# Patient Record
Sex: Male | Born: 1980 | Race: White | Hispanic: No | Marital: Single | Smoking: Former smoker
Health system: Southern US, Community
[De-identification: ages and names within clinical notes are randomized; demographics above are authoritative.]

## PROBLEM LIST (undated history)

## (undated) DIAGNOSIS — F1010 Alcohol abuse, uncomplicated: Secondary | ICD-10-CM

## (undated) DIAGNOSIS — K859 Acute pancreatitis without necrosis or infection, unspecified: Secondary | ICD-10-CM

---

## 2019-10-03 ENCOUNTER — Emergency Department (HOSPITAL_COMMUNITY): Payer: No Typology Code available for payment source

## 2019-10-03 ENCOUNTER — Other Ambulatory Visit: Payer: Self-pay

## 2019-10-03 ENCOUNTER — Emergency Department (HOSPITAL_COMMUNITY)
Admission: EM | Admit: 2019-10-03 | Discharge: 2019-10-03 | Disposition: A | Discharge status: Home / Self Care | Payer: No Typology Code available for payment source | Attending: Emergency Medicine | Admitting: Emergency Medicine

## 2019-10-03 DIAGNOSIS — K852 Alcohol induced acute pancreatitis without necrosis or infection: Secondary | ICD-10-CM

## 2019-10-03 DIAGNOSIS — R1013 Epigastric pain: Secondary | ICD-10-CM | POA: Diagnosis present

## 2019-10-03 HISTORY — DX: Acute pancreatitis without necrosis or infection, unspecified: K85.90

## 2019-10-03 HISTORY — DX: Alcohol abuse, uncomplicated: F10.10

## 2019-10-03 LAB — CBC WITH DIFFERENTIAL/PLATELET
Abs Immature Granulocytes: 0.06 10*3/uL (ref 0.00–0.07)
Basophils Absolute: 0.1 10*3/uL (ref 0.0–0.1)
Basophils Relative: 1 %
Eosinophils Absolute: 0.1 10*3/uL (ref 0.0–0.5)
Eosinophils Relative: 2 %
HCT: 40.2 % (ref 39.0–52.0)
Hemoglobin: 14.1 g/dL (ref 13.0–17.0)
Immature Granulocytes: 1 %
Lymphocytes Relative: 8 %
Lymphs Abs: 0.5 10*3/uL — ABNORMAL LOW (ref 0.7–4.0)
MCH: 35.8 pg — ABNORMAL HIGH (ref 26.0–34.0)
MCHC: 35.1 g/dL (ref 30.0–36.0)
MCV: 102 fL — ABNORMAL HIGH (ref 80.0–100.0)
Monocytes Absolute: 0.6 10*3/uL (ref 0.1–1.0)
Monocytes Relative: 9 %
Neutro Abs: 5.1 10*3/uL (ref 1.7–7.7)
Neutrophils Relative %: 79 %
Platelets: UNDETERMINED 10*3/uL (ref 150–400)
RBC: 3.94 MIL/uL — ABNORMAL LOW (ref 4.22–5.81)
RDW: 11.4 % — ABNORMAL LOW (ref 11.5–15.5)
WBC: 6.4 10*3/uL (ref 4.0–10.5)
nRBC: 0 % (ref 0.0–0.2)

## 2019-10-03 LAB — COMPREHENSIVE METABOLIC PANEL
ALT: 21 U/L (ref 0–44)
AST: 37 U/L (ref 15–41)
Albumin: 3.4 g/dL — ABNORMAL LOW (ref 3.5–5.0)
Alkaline Phosphatase: 68 U/L (ref 38–126)
Anion gap: 11 (ref 5–15)
BUN: <5 mg/dL — ABNORMAL LOW (ref 6–20)
CO2: 21 mmol/L — ABNORMAL LOW (ref 22–32)
Calcium: 8.9 mg/dL (ref 8.9–10.3)
Chloride: 105 mmol/L (ref 98–111)
Creatinine, Ser: 0.6 mg/dL — ABNORMAL LOW (ref 0.61–1.24)
GFR calc Af Amer: >60 mL/min (ref >60)
GFR calc non Af Amer: >60 mL/min (ref >60)
Glucose, Bld: 109 mg/dL — ABNORMAL HIGH (ref 70–99)
Potassium: 4.8 mmol/L (ref 3.5–5.1)
Sodium: 137 mmol/L (ref 135–145)
Total Bilirubin: 1.1 mg/dL (ref 0.3–1.2)
Total Protein: 6.1 g/dL — ABNORMAL LOW (ref 6.5–8.1)

## 2019-10-03 LAB — URINALYSIS, ROUTINE W REFLEX MICROSCOPIC
Bilirubin Urine: NEGATIVE
Glucose, UA: NEGATIVE mg/dL
Hgb urine dipstick: NEGATIVE
Ketones, ur: 5 mg/dL — AB
Leukocytes,Ua: NEGATIVE
Nitrite: NEGATIVE
Protein, ur: NEGATIVE mg/dL
Specific Gravity, Urine: 1.003 — ABNORMAL LOW (ref 1.005–1.030)
pH: 6 (ref 5.0–8.0)

## 2019-10-03 LAB — LIPASE, BLOOD: Lipase: 2743 U/L — ABNORMAL HIGH (ref 11–51)

## 2019-10-03 MED ORDER — OXYCODONE-ACETAMINOPHEN 5-325 MG PO TABS: 1.0000 | ORAL_TABLET | Freq: Four times a day (QID) | ORAL | 0 refills | Status: DC | PRN

## 2019-10-03 MED ORDER — IOHEXOL 300 MG/ML  SOLN
100.0000 mL | Freq: Once | INTRAMUSCULAR | Status: AC | PRN
  Administered 2019-10-03: 20:00:00 100 mL via INTRAVENOUS

## 2019-10-03 MED ORDER — SODIUM CHLORIDE 0.9 % IV BOLUS
1000.0000 mL | Freq: Once | INTRAVENOUS | Status: AC
  Administered 2019-10-03: 1000 mL via INTRAVENOUS

## 2019-10-03 MED ORDER — ONDANSETRON 4 MG PO TBDP: 4.0000 mg | ORAL_TABLET | Freq: Three times a day (TID) | ORAL | 0 refills | Status: DC | PRN

## 2019-10-03 NOTE — ED Provider Notes (Signed)
[facility] EMERGENCY DEPARTMENT Provider Note   CSN: [id number] Arrival date & time: 10/03/19  1712     History   Chief Complaint Chief Complaint  Patient presents with  . Abdominal Pain    HPI Eddie Campbell is a 37 y.o. male.     Patient with history of alcohol abuse presents to the emergency department with complaint of intermittent abdominal pain.  Patient states that he was a heavy drinker up until about 1 month ago.  He states that he has been sober since that time.  Patient states that starting about a week ago he had intermittent episodes of sudden abdominal pain lasting for several hours at a time.  Patient then had several days where he did not have any symptoms at all but then had an episode today prompting emergency department visit.  Pain was very intense when it occurred.  Symptoms are currently resolved.  No associated nausea, vomiting, constipation.  No fevers, chest pain or shortness of breath.  No urinary symptoms.  Several days ago patient did have a few episodes of nonbloody diarrhea.  No history of heavy NSAID use.  Patient does not have a history of gallstones or gallbladder disease.  He is not diabetic.      Past Medical History:  Diagnosis Date  . Acute pancreatitis   . Alcohol abuse   . Pancreatic pseudocyst     There are no active problems to display for this patient.   The histories are not reviewed yet. Please review them in the "History" navigator section and refresh this SmartLink.      Home Medications    Prior to Admission medications   Medication Sig Start Date End Date Taking? Authorizing Provider  ondansetron (ZOFRAN ODT) 4 MG disintegrating tablet Take 1 tablet (4 mg total) by mouth every 8 (eight) hours as needed for nausea or vomiting. 10/03/19   Renne Crigler, PA-C  oxyCODONE-acetaminophen (PERCOCET/ROXICET) 5-325 MG tablet Take 1 tablet by mouth every 6 (six) hours as needed for severe pain. 10/03/19   Renne Crigler, PA-C    Family History No family history on file.  Social History Social History   Tobacco Use  . Smoking status: Not on file  Substance Use Topics  . Alcohol use: Yes  . Drug use: Not on file     Allergies   Patient has no known allergies.   Review of Systems Review of Systems  Constitutional: Negative for fever.  HENT: Negative for rhinorrhea and sore throat.   Eyes: Negative for redness.  Respiratory: Negative for cough.   Cardiovascular: Negative for chest pain.  Gastrointestinal: Positive for abdominal pain (Resolved) and diarrhea (Resolved). Negative for nausea and vomiting.  Genitourinary: Negative for dysuria.  Musculoskeletal: Negative for myalgias.  Skin: Negative for rash.  Neurological: Negative for headaches.     Physical Exam Updated Vital Signs BP (!) 145/109 (BP Location: Right Arm)   Pulse 85   Temp 98.3 F (36.8 C) (Oral)   Resp 16   Ht  (1.88 m)   Wt 81.6 kg   SpO2 99%   BMI 23.11 kg/m   Physical Exam Vitals signs and nursing note reviewed.  Constitutional:      Appearance: He is well-developed.  HENT:     Head: Normocephalic and atraumatic.  Eyes:     General:        Right eye: No discharge.        Left eye: No discharge.  Conjunctiva/sclera: Conjunctivae normal.  Neck:     Musculoskeletal: Normal range of motion and neck supple.  Cardiovascular:     Rate and Rhythm: Normal rate and regular rhythm.     Heart sounds: Normal heart sounds.  Pulmonary:     Effort: Pulmonary effort is normal.     Breath sounds: Normal breath sounds.  Abdominal:     Palpations: Abdomen is soft.     Tenderness: There is no abdominal tenderness. There is no guarding or rebound.     Hernia: No hernia is present.  Skin:    General: Skin is warm and dry.  Neurological:     Mental Status: He is alert.      ED Treatments / Results  Labs (all labs ordered are listed, but only abnormal results are displayed) Labs Reviewed  CBC  WITH DIFFERENTIAL/PLATELET - Abnormal; Notable for the following components:      Result Value   RBC 3.94 (*)    MCV 102.0 (*)    MCH 35.8 (*)    RDW 11.4 (*)    Lymphs Abs 0.5 (*)    All other components within normal limits  COMPREHENSIVE METABOLIC PANEL - Abnormal; Notable for the following components:   CO2 21 (*)    Glucose, Bld 109 (*)    BUN <5 (*)    Creatinine, Ser 0.60 (*)    Total Protein 6.1 (*)    Albumin 3.4 (*)    All other components within normal limits  LIPASE, BLOOD - Abnormal; Notable for the following components:   Lipase 2,743 (*)    All other components within normal limits  URINALYSIS, ROUTINE W REFLEX MICROSCOPIC - Abnormal; Notable for the following components:   Color, Urine STRAW (*)    Specific Gravity, Urine 1.003 (*)    Ketones, ur 5 (*)    All other components within normal limits  CBC WITH DIFFERENTIAL/PLATELET    EKG None  Radiology Ct Abdomen Pelvis W Contrast  Result Date: 10/03/2019 CLINICAL DATA:  Intense abdominal pain epigastric pain EXAM: CT ABDOMEN AND PELVIS WITH CONTRAST TECHNIQUE: Multidetector CT imaging of the abdomen and pelvis was performed using the standard protocol following bolus administration of intravenous contrast. CONTRAST:  128mL OMNIPAQUE IOHEXOL 300 MG/ML  SOLN COMPARISON:  Radiograph 10/03/2019 FINDINGS: Lower chest: Lung bases demonstrate no acute consolidation or pleural effusion. The heart size is normal. Hepatobiliary: Contracted gallbladder. No calcified stones. No biliary dilatation. No focal hepatic abnormality. Pancreas: Enlarged. Moderate surrounding inflammatory changes consistent with acute pancreatitis. Small rounded fluid collections at the pancreatic head and uncinate process, the largest measures 15 mm in size. Scattered punctate calcifications at the pancreatic head. Spleen: Normal in size without focal abnormality. Adrenals/Urinary Tract: Adrenal glands are unremarkable. Kidneys are normal, without renal  calculi, focal lesion, or hydronephrosis. Bladder is unremarkable. Stomach/Bowel: Moderate enlargement of the stomach. Thickened appearance of the duodenal bulb and second and proximal third portion of the duodenum. No dilated small bowel. No colon wall thickening. Appendix not well seen but no right lower quadrant inflammatory process Vascular/Lymphatic: Nonaneurysmal aorta. Patent portal veins. Slight narrowed appearance of the distal splenic vein but without occlusion. Reproductive: Prostate is unremarkable. Other: No free air. Small fluid within the anterior pararenal spaces. Musculoskeletal: Mild superior endplate deformity at L1. IMPRESSION: 1. Enlarged pancreas with moderate edema and inflammatory changes consistent with acute pancreatitis. Several small focal fluid collections are present at the pancreatic head and uncinate process measuring up to 15 mm, possible small pseudocysts  versus cystic pancreatic masses. 2. Thickening of the duodenal bulb, second and proximal third portion of the duodenum consistent with duodenitis and reactive inflammation from pancreatitis 3. Narrowed appearance of the distal splenic vein likely extrinsic compression from inflammation. Portal vessels are patent. Electronically Signed   By: Jasmine Pang M.D.   On: 10/03/2019 21:07   Dg Abd 2 Views  Result Date: 10/03/2019 CLINICAL DATA:  Intermittent abdominal pain EXAM: ABDOMEN - 2 VIEW COMPARISON:  None. FINDINGS: The bowel gas pattern is normal. Moderate amount of colonic stool. No radio-opaque calculi or other significant radiographic abnormality is seen. IMPRESSION: Nonobstructive bowel gas pattern. Electronically Signed   By: Jonna Clark M.D.   On: 10/03/2019 20:05    Procedures Procedures (including critical care time)  Medications Ordered in ED Medications  sodium chloride 0.9 % bolus 1,000 mL (0 mLs Intravenous Stopped 10/03/19 2113)  iohexol (OMNIPAQUE) 300 MG/ML solution 100 mL (100 mLs Intravenous Contrast  Given 10/03/19 2026)     Initial Impression / Assessment and Plan / ED Course  I have reviewed the triage vital signs and the nursing notes.  Pertinent labs & imaging results that were available during my care of the patient were reviewed by me and considered in my medical decision making (see chart for details).        Patient seen and examined. Work-up initiated. Patient asymptomatic.   Vital signs reviewed and are as follows: BP (!) 145/109 (BP Location: Right Arm)   Pulse 85   Temp 98.3 F (36.8 C) (Oral)   Resp 16   Ht 6\' 2"  (1.88 m)   Wt 81.6 kg   SpO2 99%   BMI 23.11 kg/m   Lipase 2743.  Patient remains asymptomatic.  Given this presentation, will further evaluate pancreas with CT imaging.  CT demonstrates acute pancreatitis with reactive inflammation in the intestines.  I discussed these findings with Dr. of GI.  He agrees that if symptoms are controlled, patient can be discharged home.  Advises clear liquid diet for the next 48 hours.  We will discharged home with medication for pain and vomiting to use as needed.  Patient and mother at bedside, agree with plan.  Counseled on avoidance of NSAIDs for the current time, and permanent alcohol abstinence.  Patient counseled on use of narcotic pain medications. Counseled not to combine these medications with others containing tylenol. Urged not to drink alcohol, drive, or perform any other activities that requires focus while taking these medications. The patient verbalizes understanding and agrees with the plan.  The patient was urged to return to the Emergency Department immediately with worsening of current symptoms, worsening abdominal pain, persistent vomiting, blood noted in stools, fever, or any other concerns. The patient verbalized understanding.    Final Clinical Impressions(s) / ED Diagnoses   Final diagnoses:  Alcohol-induced acute pancreatitis without infection or necrosis   Patient with acute  pancreatitis, possibly alcohol induced, however minimal symptoms at the current time.  There are signs of potential pancreatic pseudocysts.  Evaluation as above.  Obtained GI follow-up and recommendations.  Patient counseled on signs and symptoms to return.    ED Discharge Orders         Ordered    oxyCODONE-acetaminophen (PERCOCET/ROXICET) 5-325 MG tablet  Every 6 hours PRN     10/03/19 2255    ondansetron (ZOFRAN ODT) 4 MG disintegrating tablet  Every 8 hours PRN     10/03/19 2255  Renne CriglerGeiple, Samaad Hashem, PA-C 10/04/19 0024    Tegeler, Canary Brimhristopher J, MD 10/04/19 [phone]

## 2019-10-03 NOTE — ED Notes (Signed)
Patient transported to CT 

## 2019-10-03 NOTE — Discharge Instructions (Signed)
Please read and follow all provided instructions.  Your diagnoses today include:  1. Alcohol-induced acute pancreatitis without infection or necrosis    Tests performed today include:  Blood counts and electrolytes  Blood tests to check liver and kidney function  Blood tests to check pancreas function  Urine test to look for infection and pregnancy (in women)  Vital signs. See below for your results today.   Medications prescribed:   Take any prescribed medications only as directed.  Home care instructions:   Follow any educational materials contained in this packet.  Follow-up instructions: Please follow-up with your primary care provider in the next 2 days for further evaluation of your symptoms.    Return instructions:  SEEK IMMEDIATE MEDICAL ATTENTION IF:  The pain does not go away or becomes severe   A temperature above 101F develops   Repeated vomiting occurs (multiple episodes)   The pain becomes localized to portions of the abdomen. The right side could possibly be appendicitis. In an adult, the left lower portion of the abdomen could be colitis or diverticulitis.   Blood is being passed in stools or vomit (bright red or black tarry stools)   You develop chest pain, difficulty breathing, dizziness or fainting, or become confused, poorly responsive, or inconsolable (young children)  If you have any other emergent concerns regarding your health  Additional Information: Abdominal (belly) pain can be caused by many things. Your caregiver performed an examination and possibly ordered blood/urine tests and imaging (CT scan, x-rays, ultrasound). Many cases can be observed and treated at home after initial evaluation in the emergency department. Even though you are being discharged home, abdominal pain can be unpredictable. Therefore, you need a repeated exam if your pain does not resolve, returns, or worsens. Most patients with abdominal pain don't have to be admitted  to the hospital or have surgery, but serious problems like appendicitis and gallbladder attacks can start out as nonspecific pain. Many abdominal conditions cannot be diagnosed in one visit, so follow-up evaluations are very important.  Your vital signs today were: BP (!) 136/103    Pulse 88    Temp 98.3 F (36.8 C) (Oral)    Resp 15    Ht 6\' 2"  (1.88 m)    Wt 81.6 kg    SpO2 100%    BMI 23.11 kg/m  If your blood pressure (bp) was elevated above 135/85 this visit, please have this repeated by your doctor within one month. --------------

## 2019-10-03 NOTE — ED Triage Notes (Signed)
Pt presents from home with episodes of sudden, intense abd pain lasting approx a couple of hours at a time for 1 week. Another episode occurred again today at lunch and was more intense than it had been. Positive diarrhea sat and sun x5 each day, watery, no blood. Denies n/v.

## 2019-10-03 NOTE — ED Notes (Addendum)
Pt aware of need for urine sample, urinal at bedside 

## 2019-10-03 NOTE — ED Notes (Signed)
Patient transported to X-ray 

## 2019-10-03 NOTE — ED Triage Notes (Addendum)
Pt had been "drinking heavily" prior to beginning librium 2 weeks ago, no longer drinking alcohol, has not taken librium this week. 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

## 2019-10-03 NOTE — ED Notes (Signed)
Josh, PA is aware of BP. No new orders at this time.

## 2019-10-04 ENCOUNTER — Encounter (HOSPITAL_COMMUNITY): Payer: Self-pay | Admitting: Emergency Medicine

## 2019-10-05 ENCOUNTER — Telehealth: Payer: Self-pay | Admitting: Physician Assistant

## 2019-10-05 ENCOUNTER — Telehealth: Payer: Self-pay

## 2019-10-05 ENCOUNTER — Encounter: Payer: Self-pay | Admitting: Physician Assistant

## 2019-10-05 NOTE — Telephone Encounter (Signed)
Scheduled pt to see Dr. Bryan Lemma Friday 10/9@10 :40am. Pts mother aware of appt.

## 2019-10-05 NOTE — Telephone Encounter (Signed)
-----   Message from Gatha Mayer, MD sent at 10/03/2019 10:28 PM EDT ----- Regarding: Needs appointment Needs an appointment this week  Dx pancreatitis  If no available spots will work him in with me  Be sure to check Dr. Bryan Lemma we can send him there  Took a call from ED  I

## 2019-10-05 NOTE — Telephone Encounter (Signed)
Called patient back and spoke with Mom. Let her know Dr. Celesta Aver ED note said patient should stay on clear liquid diet for 48 hrs. A sooner appt. Was made with Dr. Bryan Lemma, per Dr. Carlean Purl. Sent message to DOD Dr. Loletha Carrow reference request for more pain med

## 2019-10-05 NOTE — Telephone Encounter (Signed)
Called patient's Mom back and let her know Dr. Loletha Carrow is not comfortable ordering pain med for a patient that has not seen any of the providers in our practice. Patient does have a PCP that he has seen, she will call them.

## 2019-10-05 NOTE — Telephone Encounter (Signed)
This patient has never been seen by a provider in this practice, either in the hospital or office.  He also has a history of substance abuse.  As such, I am not comfortable prescribing pain medicine for this patient.  If he is unable to tolerate a BRAT diet without nausea vomiting or increased pain, and if he is unable to see his primary care provider (or does not have one), then he should return to the emergency department.

## 2019-10-05 NOTE — Telephone Encounter (Signed)
Pts mother states pt was in ED on 10-03-19 DX Pancreatitis alcohol induced/ abd pain stated she needed  f/u w/Dr Carlean Purl in 3 days. Dr Carlean Purl does not have any availabel openings until November, Scheduled w/ Alen Blew on the 10-13-19  would like a sooner appt. Pt is on liquid  diet  and is on pain killers now, would like to know when pt can  have a bland diet?  Please advise  Pt also will be out of pain pill and  would like refill on  Oxycodonie  Please send [facility] [address] [place], [place]

## 2019-10-06 ENCOUNTER — Other Ambulatory Visit: Payer: Self-pay

## 2019-10-06 DIAGNOSIS — K859 Acute pancreatitis without necrosis or infection, unspecified: Secondary | ICD-10-CM

## 2019-10-06 NOTE — Telephone Encounter (Signed)
Thanks

## 2019-10-06 NOTE — Telephone Encounter (Signed)
Called and spoke to patient's Mom. She said patient has been doing better, eating bland foods and no fever. And he did not need to take his 5th pain pill. Scheduled for office visit with Dr. Carlean Purl 10/07/19 at 3:50pm. CBC, CMET and Lipase ordered and he will come in tomorrow am for those. Asked patient to go to ED if he starts having worse pain or fever before his office visit tomorrow

## 2019-10-06 NOTE — Telephone Encounter (Signed)
He already has an office visit scheduled with Dr. Bryan Lemma on Friday 10/09/19 at 10:40am. Please advise

## 2019-10-06 NOTE — Telephone Encounter (Signed)
I can see him tomorrow at end of day 350 PM or Friday at end of AM session or end of PM session  We can add him on

## 2019-10-06 NOTE — Telephone Encounter (Signed)
I think that may be too long to wait - given his pancreatitis  If he is really struggling with pain control today  (may be) or fever of any kind needs to return to ED as may need to be admitted for further treatment  I am willing to see him tomorrow - at end of PM session  Also - Have him come for labs tomorrow AM - CBC, CMET, lipase, unless he goes back to ED  Dx pancreatitis

## 2019-10-07 ENCOUNTER — Ambulatory Visit (INDEPENDENT_AMBULATORY_CARE_PROVIDER_SITE_OTHER): Payer: PRIVATE HEALTH INSURANCE | Admitting: Internal Medicine

## 2019-10-07 ENCOUNTER — Other Ambulatory Visit (INDEPENDENT_AMBULATORY_CARE_PROVIDER_SITE_OTHER): Payer: PRIVATE HEALTH INSURANCE

## 2019-10-07 ENCOUNTER — Encounter: Payer: Self-pay | Admitting: Internal Medicine

## 2019-10-07 VITALS — BP 110/82 | HR 110 | Temp 97.8°F | Ht 74.0 in | Wt 166.4 lb

## 2019-10-07 DIAGNOSIS — K8689 Other specified diseases of pancreas: Secondary | ICD-10-CM

## 2019-10-07 DIAGNOSIS — K852 Alcohol induced acute pancreatitis without necrosis or infection: Secondary | ICD-10-CM

## 2019-10-07 DIAGNOSIS — K859 Acute pancreatitis without necrosis or infection, unspecified: Secondary | ICD-10-CM | POA: Diagnosis not present

## 2019-10-07 LAB — CBC WITH DIFFERENTIAL/PLATELET
Basophils Absolute: 0.1 10*3/uL (ref 0.0–0.1)
Basophils Relative: 1 % (ref 0.0–3.0)
Eosinophils Absolute: 0.2 10*3/uL (ref 0.0–0.7)
Eosinophils Relative: 3.6 % (ref 0.0–5.0)
HCT: 41 % (ref 39.0–52.0)
Hemoglobin: 14 g/dL (ref 13.0–17.0)
Lymphocytes Relative: 21.6 % (ref 12.0–46.0)
Lymphs Abs: 1.2 10*3/uL (ref 0.7–4.0)
MCHC: 34.1 g/dL (ref 30.0–36.0)
MCV: 101.6 fl — ABNORMAL HIGH (ref 78.0–100.0)
Monocytes Absolute: 0.6 10*3/uL (ref 0.1–1.0)
Monocytes Relative: 10.3 % (ref 3.0–12.0)
Neutro Abs: 3.5 10*3/uL (ref 1.4–7.7)
Neutrophils Relative %: 63.5 % (ref 43.0–77.0)
Platelets: 294 10*3/uL (ref 150.0–400.0)
RBC: 4.03 Mil/uL — ABNORMAL LOW (ref 4.22–5.81)
RDW: 12.9 % (ref 11.5–15.5)
WBC: 5.5 10*3/uL (ref 4.0–10.5)

## 2019-10-07 LAB — LIPASE: Lipase: 545 U/L — ABNORMAL HIGH (ref 11.0–59.0)

## 2019-10-07 LAB — COMPREHENSIVE METABOLIC PANEL
ALT: 11 U/L (ref 0–53)
AST: 15 U/L (ref 0–37)
Albumin: 4.1 g/dL (ref 3.5–5.2)
Alkaline Phosphatase: 86 U/L (ref 39–117)
BUN: 6 mg/dL (ref 6–23)
CO2: 25 mEq/L (ref 19–32)
Calcium: 9.8 mg/dL (ref 8.4–10.5)
Chloride: 100 mEq/L (ref 96–112)
Creatinine, Ser: 0.65 mg/dL (ref 0.40–1.50)
GFR: 139.01 mL/min (ref >60.00)
Glucose, Bld: 92 mg/dL (ref 70–99)
Potassium: 4.4 mEq/L (ref 3.5–5.1)
Sodium: 138 mEq/L (ref 135–145)
Total Bilirubin: 0.8 mg/dL (ref 0.2–1.2)
Total Protein: 7.5 g/dL (ref 6.0–8.3)

## 2019-10-07 MED ORDER — OXYCODONE-ACETAMINOPHEN 5-325 MG PO TABS: 1.0000 | ORAL_TABLET | Freq: Four times a day (QID) | ORAL | 0 refills | Status: AC | PRN

## 2019-10-07 NOTE — Patient Instructions (Signed)
Good to meet you today and glad you are feeling better  1) Low fat diet for next couple of weeks  2) Percocet rx as needed # 10  3) You should look into help for alcohol use and need to stay of it completely as we discussed and you are aware.  4) Call us in late November with an idea of when you will be back in the area and we will schedule a CT scan and labs, etc  5) If you get sick in [place] seek care there - if bad pain recurs then you should go to a clear liquid diet and seek help.   I appreciate the opportunity to care for you. Gatha Mayer, MD, [facility]

## 2019-10-09 ENCOUNTER — Ambulatory Visit: Payer: PRIVATE HEALTH INSURANCE | Admitting: Gastroenterology

## 2019-10-10 ENCOUNTER — Encounter: Payer: Self-pay | Admitting: Internal Medicine

## 2019-10-10 NOTE — Progress Notes (Signed)
Eddie Campbell 37 y.o. 02-22-1981 [id number]  Assessment & Plan:   Encounter Diagnoses  Name Primary?  . Alcohol-induced acute pancreatitis without infection or necrosis Yes  . Peripancreatic fluid collections     He seems clinically well.  He needs to abstain from alcohol and is aware of that.  I have recommended he consider help with this rather than just on his own.  He can gradually advance his diet but stay on a low-fat diet.   He is returning to his home in [place] but will be back here at Christmas.  We have decided to repeat a CT scan in December when he returns, but if he deteriorates or has other issues before then he will need to seek care in [place].  He is to contact us before he comes and we can arrange scanning and an office visit follow-up.  Meds ordered this encounter  Medications  . oxyCODONE-acetaminophen (PERCOCET) 5-325 MG tablet    Sig: Take 1 tablet by mouth every 6 (six) hours as needed for up to 5 days for severe pain.    Dispense:  10 tablet    Refill:  0    Subjective:   Chief Complaint: Pancreatitis  HPI Patient is a 38 year old white man who lives in [place] but was visiting his mother in [place], with a history of alcoholism abstinent for about a month or so with recent diagnosis of acute pancreatitis and peripancreatic fluid collections and small cystic changes in the head of the pancreas.  He was seen in the emergency department at [facility] over the past weekend.  He was doing well enough at that time to be discharged.  His lipase was greater than 2000.  No fevers.  Since that time is been on a bland diet, he is not having nausea or vomiting or severe pain.  Overall he feels better.  He lives in [place] and is planning to return there soon.  He has been through alcohol rehab before it sounds like and may be even AAA but is not inclined to pursue that again.   CT impression from 10/03/2019 IMPRESSION: 1. Enlarged pancreas with  moderate edema and inflammatory changes consistent with acute pancreatitis. Several small focal fluid collections are present at the pancreatic head and uncinate process measuring up to 15 mm, possible small pseudocysts versus cystic pancreatic masses. 2. Thickening of the duodenal bulb, second and proximal third portion of the duodenum consistent with duodenitis and reactive inflammation from pancreatitis 3. Narrowed appearance of the distal splenic vein likely extrinsic compression from inflammation. Portal vessels are patent.   No Known Allergies No outpatient medications have been marked as taking for the 10/07/19 encounter (Office Visit) with Iva Boop, MD.   Past Medical History:  Diagnosis Date  . Acute pancreatitis   . Alcohol abuse    History reviewed. No pertinent surgical history. Social History   Social History Narrative   He is single and lives in [place] mother lives in [place]      PhD in statistics from [place]   Unemployed   Alcoholism, abstinent since again September 2020   Former user of cigarettes, oral tobacco.  Former user of marijuana.   No current drug use or tobacco use.   Family history is noncontributory   Review of Systems As per HPI.  All other review of systems appear negative.  Objective:   Physical Exam @BP  110/82   Pulse (!) 110   Temp 97.8 F (  36.6 C)   Ht 6\' 2"  (1.88 m)   Wt 166 lb 6 oz (75.5 kg)   BMI 21.36 kg/m @  General:  Thin white man no acute distress appears well-developed well-nourished overall Eyes:  anicteric. ENT:   Mouth and posterior pharynx free of lesions.  Neck:   supple w/o thyromegaly or mass.  Lungs: Clear to auscultation bilaterally. Heart:  S1S2, no rubs, murmurs, gallops. Abdomen:  soft, non-tender, no hepatosplenomegaly, hernia, or mass and BS+.  Lymph:  no cervical or supraclavicular adenopathy. Extremities:   no edema, cyanosis or clubbing Skin   no rash. Neuro:  A&O x 3.   Psych:  Flat mood and affect mild to moderate   Data Reviewed: See HPI

## 2019-10-13 ENCOUNTER — Ambulatory Visit: Payer: PRIVATE HEALTH INSURANCE | Admitting: Physician Assistant

## 2020-01-06 ENCOUNTER — Encounter: Payer: Self-pay | Admitting: Internal Medicine

## 2020-01-06 ENCOUNTER — Telehealth: Payer: Self-pay

## 2020-01-06 ENCOUNTER — Ambulatory Visit (INDEPENDENT_AMBULATORY_CARE_PROVIDER_SITE_OTHER): Payer: PRIVATE HEALTH INSURANCE | Admitting: Internal Medicine

## 2020-01-06 ENCOUNTER — Other Ambulatory Visit (INDEPENDENT_AMBULATORY_CARE_PROVIDER_SITE_OTHER): Payer: PRIVATE HEALTH INSURANCE

## 2020-01-06 VITALS — BP 118/60 | HR 80 | Temp 98.4°F | Ht 73.0 in | Wt 150.0 lb

## 2020-01-06 DIAGNOSIS — K8689 Other specified diseases of pancreas: Secondary | ICD-10-CM

## 2020-01-06 DIAGNOSIS — K852 Alcohol induced acute pancreatitis without necrosis or infection: Secondary | ICD-10-CM

## 2020-01-06 LAB — CBC WITH DIFFERENTIAL/PLATELET
Basophils Absolute: 0 10*3/uL (ref 0.0–0.1)
Basophils Relative: 0.7 % (ref 0.0–3.0)
Eosinophils Absolute: 0.1 10*3/uL (ref 0.0–0.7)
Eosinophils Relative: 1.8 % (ref 0.0–5.0)
HCT: 41.6 % (ref 39.0–52.0)
Hemoglobin: 14.1 g/dL (ref 13.0–17.0)
Lymphocytes Relative: 26.5 % (ref 12.0–46.0)
Lymphs Abs: 1.3 10*3/uL (ref 0.7–4.0)
MCHC: 33.9 g/dL (ref 30.0–36.0)
MCV: 91.7 fl (ref 78.0–100.0)
Monocytes Absolute: 0.5 10*3/uL (ref 0.1–1.0)
Monocytes Relative: 10.4 % (ref 3.0–12.0)
Neutro Abs: 2.9 10*3/uL (ref 1.4–7.7)
Neutrophils Relative %: 60.6 % (ref 43.0–77.0)
Platelets: 256 10*3/uL (ref 150.0–400.0)
RBC: 4.54 Mil/uL (ref 4.22–5.81)
RDW: 13.1 % (ref 11.5–15.5)
WBC: 4.7 10*3/uL (ref 4.0–10.5)

## 2020-01-06 LAB — COMPREHENSIVE METABOLIC PANEL
ALT: 10 U/L (ref 0–53)
AST: 15 U/L (ref 0–37)
Albumin: 4.6 g/dL (ref 3.5–5.2)
Alkaline Phosphatase: 74 U/L (ref 39–117)
BUN: 8 mg/dL (ref 6–23)
CO2: 27 mEq/L (ref 19–32)
Calcium: 10 mg/dL (ref 8.4–10.5)
Chloride: 103 mEq/L (ref 96–112)
Creatinine, Ser: 0.71 mg/dL (ref 0.40–1.50)
GFR: 125.37 mL/min (ref >60.00)
Glucose, Bld: 100 mg/dL — ABNORMAL HIGH (ref 70–99)
Potassium: 4.3 mEq/L (ref 3.5–5.1)
Sodium: 138 mEq/L (ref 135–145)
Total Bilirubin: 0.6 mg/dL (ref 0.2–1.2)
Total Protein: 7.5 g/dL (ref 6.0–8.3)

## 2020-01-06 LAB — LIPASE: Lipase: 40 U/L (ref 11.0–59.0)

## 2020-01-06 NOTE — Patient Instructions (Signed)
Your provider has requested that you go to the basement level for lab work before leaving today. Press "B" on the elevator. The lab is located at the first door on the left as you exit the elevator.   We are providing you with a printed order for a CT scan to have done and they can fax [place] the results.   We are also providing you with a copy of the last CT scan to take with you for them to have for comparison.    I appreciate the opportunity to care for you. Stan Head, MD, [facility]

## 2020-01-06 NOTE — Telephone Encounter (Signed)
I called and spoke to Durante and told him to call and let us know where in [place] he will be going for his CT scan so [facility] can check to see if it needs a pre-cert.  He verbalized understanding.

## 2020-01-06 NOTE — Progress Notes (Signed)
   Eddie Campbell 39 y.o. [birth year] [id number]  Assessment & Plan:   Encounter Diagnoses  Name Primary?  . Alcohol-induced acute pancreatitis without infection or necrosis Yes  . Peripancreatic fluid collections      Orders Placed This Encounter  Procedures  . CT Abdomen Pelvis W Contrast  . Lipase  . Comp Met (CMET)  . CBC w/Diff   Will call and send lab result He was givenm paper order request for a CT - to be done in [place]  F/u in [place],  Subjective:   Chief Complaint: pancreatitis f/u  HPI Here for f/u alcoholic pancreatitis w/ fludi collections. Last seen 10/02/2019 after seen in ED dx w/ acute alcoholic pancreatitis - he lives in [place] and was to call me before returning to mom's at [place] and were to do a CT abd/pelvis to f/u. He reports abstinence from EtOH.   No pain Weight dwo n but eating   Wt Readings from Last 3 Encounters:  01/06/20 150 lb (68 kg)  10/07/19 166 lb 6 oz (75.5 kg)  10/03/19 180 lb (81.6 kg)   He is set to return to [employer]. He did not recall our plan to repeat CT  No Known Allergies Current Meds  Medication Sig  . Multiple Vitamin (ONE-A-DAY MENS PO) Take 1 tablet by mouth daily.   Past Medical History:  Diagnosis Date  . Acute pancreatitis   . Alcohol abuse    History reviewed. No pertinent surgical history. Social History   Social History Narrative   He is single and lives in [place] mother lives in [place]      PhD in statistics from [place]   Unemployed   Alcoholism, abstinent since again September 2020   Former user of cigarettes, oral tobacco.  Former user of marijuana.   No current drug use or tobacco use.   family history is not on file.   Review of Systems As above  Objective:   Physical Exam '@BP'$  118/60 (BP Location: Left Arm, Patient Position: Sitting, Cuff Size: Normal)   Pulse 80   Temp 98.4 F (36.9 C)   Ht '6\' 1"'$  (1.854 m) Comment: height measured without shoes  Wt  150 lb (68 kg)   BMI 19.79 kg/m @  General:  NAD Eyes:   anicteric Lungs:  clear Heart::  S1S2 no rubs, murmurs or gallops Abdomen:  soft and nontender, BS+ Ext:   no edema, cyanosis or clubbing    Data Reviewed:   See HPI

## 2020-01-14 DIAGNOSIS — K852 Alcohol induced acute pancreatitis without necrosis or infection: Secondary | ICD-10-CM | POA: Insufficient documentation

## 2020-01-14 DIAGNOSIS — K8689 Other specified diseases of pancreas: Secondary | ICD-10-CM | POA: Insufficient documentation

## 2021-03-16 IMAGING — CT CT ABD-PELV W/ CM
2 of 4 series · 15 of 46 positions shown, 17 images · IV contrast (omnipaque)
Comparison: Radiograph 10/03/2019

CLINICAL DATA: Intense abdominal pain epigastric pain

EXAM:
CT ABDOMEN AND PELVIS WITH CONTRAST
TECHNIQUE: Multidetector CT imaging of the abdomen and pelvis was performed
using the standard protocol following bolus administration of
intravenous contrast.
CONTRAST:  100mL OMNIPAQUE IOHEXOL 300 MG/ML  SOLN

[Series 3: abd/ pelvis 5.0 i30f 2 · axial · 0.77mm/px · z∈[+846,+1356]mm · 12 of 112 slices shown, 14 images]
[im 5/112  soft-tissue]
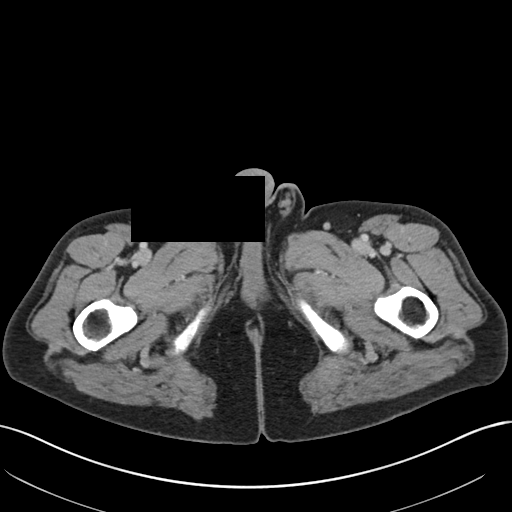
[im 5/112  bone]
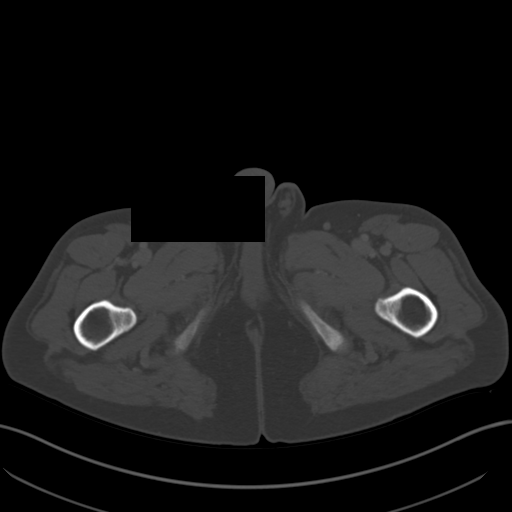
[im 14/112  soft-tissue]
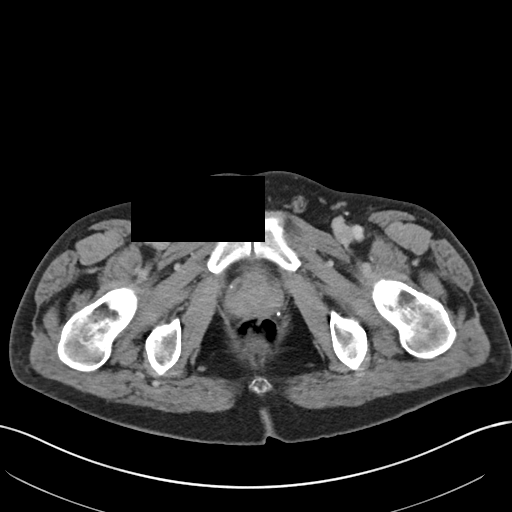
[im 24/112  soft-tissue]
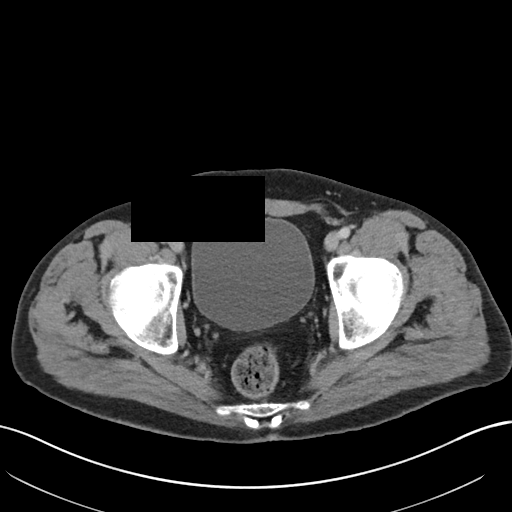
[im 33/112  soft-tissue]
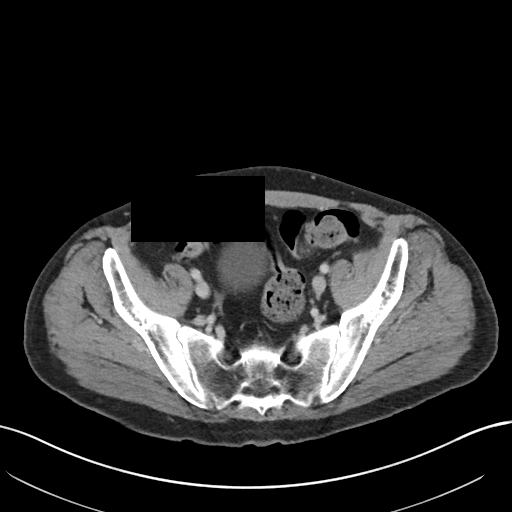
[im 42/112  soft-tissue]
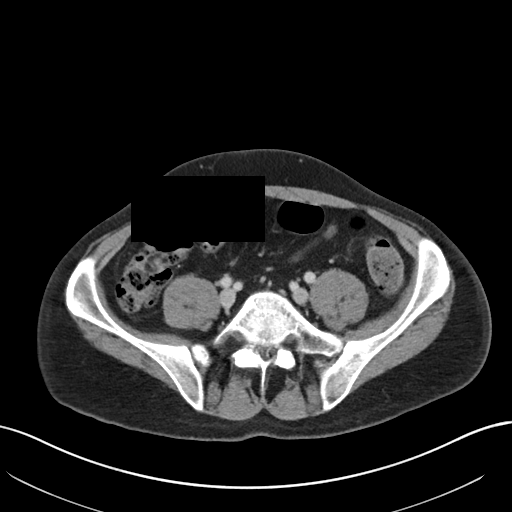
[im 51/112  soft-tissue]
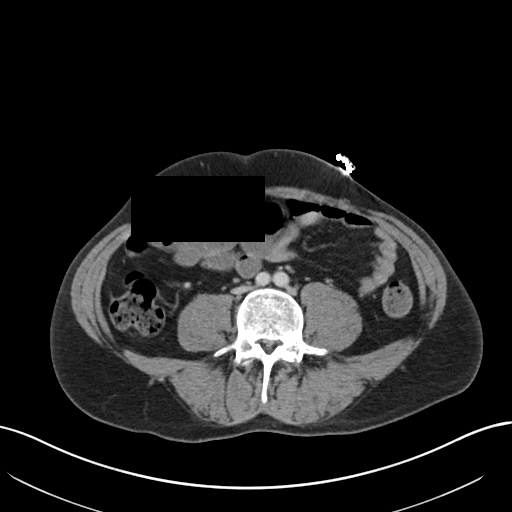
[im 61/112  soft-tissue]
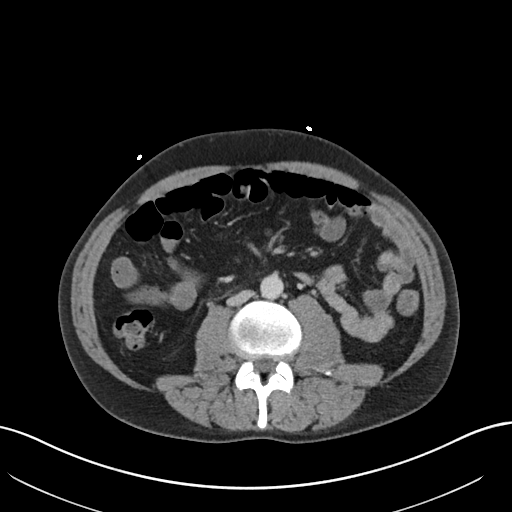
[im 70/112  soft-tissue]
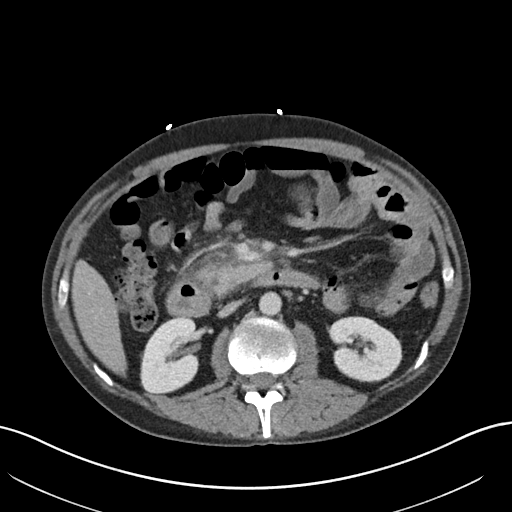
[im 79/112  soft-tissue]
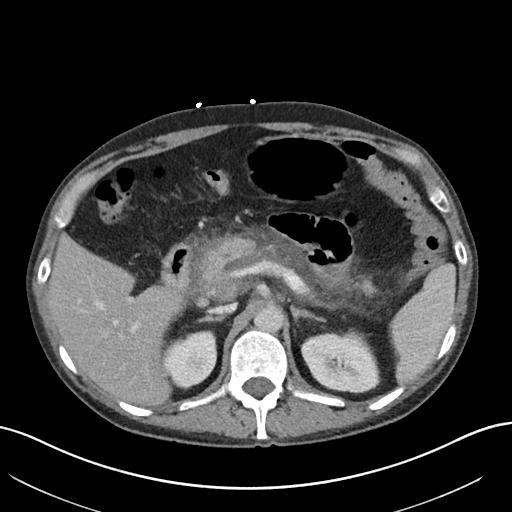
[im 79/112  bone]
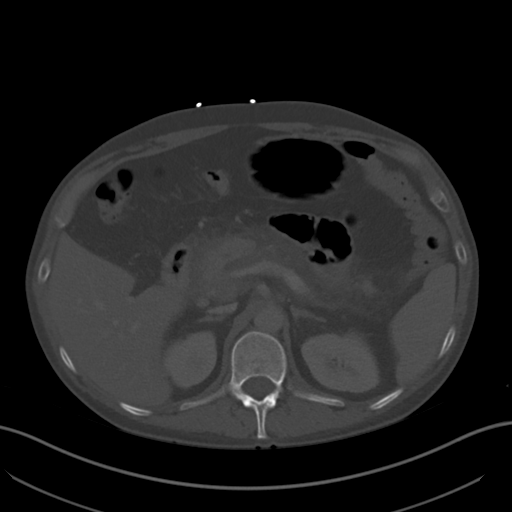
[im 88/112  soft-tissue]
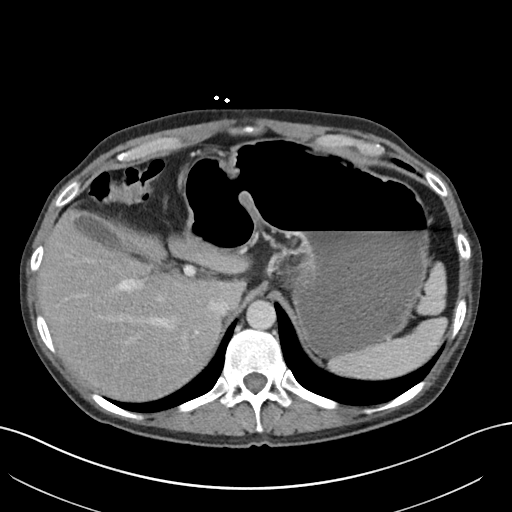
[im 98/112  soft-tissue]
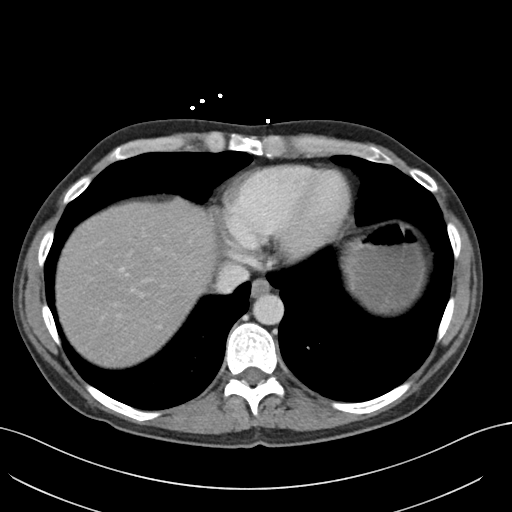
[im 107/112  soft-tissue]
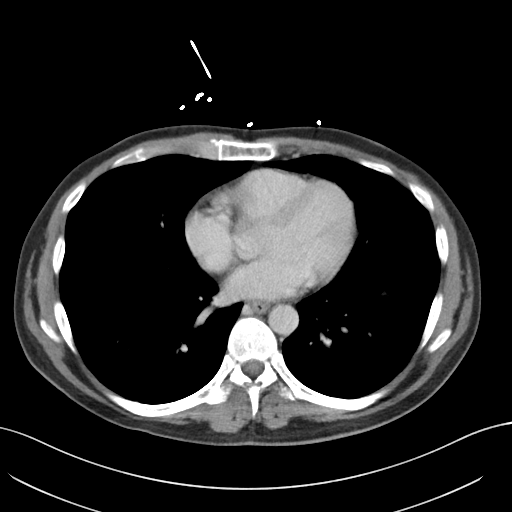

[Series 6: coronal soft tissue · coronal · 0.72mm/px · 3 of 101 slices shown]
[im 34/101  soft-tissue]
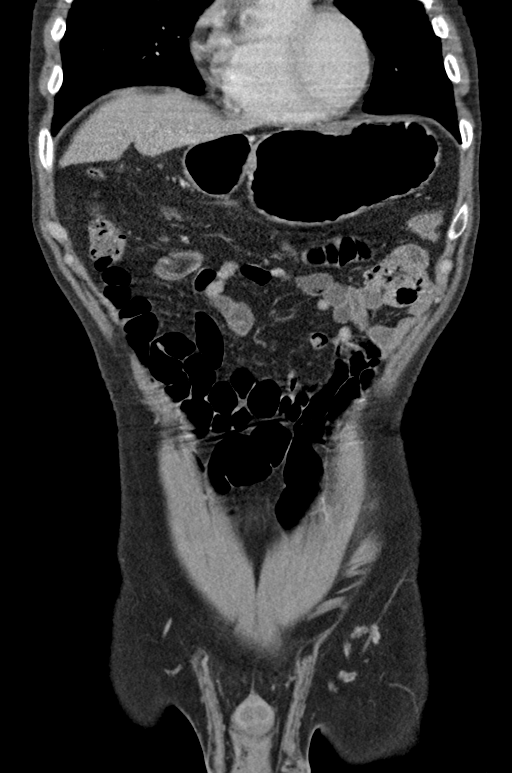
[im 45/101  soft-tissue]
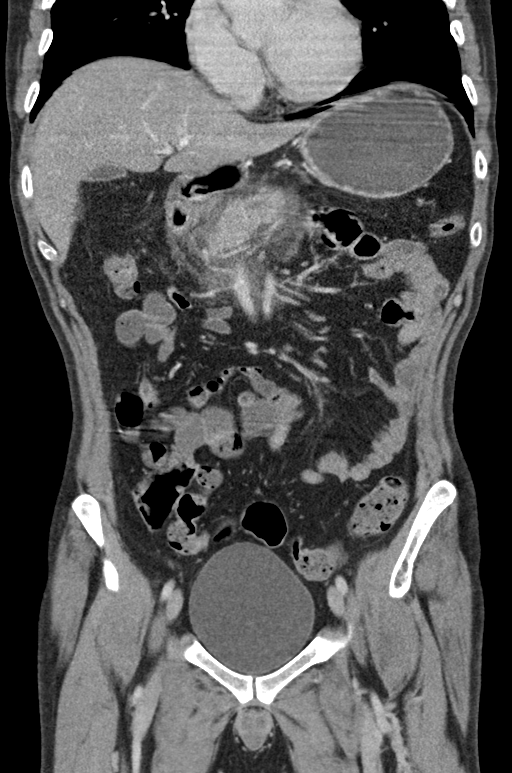
[im 56/101  soft-tissue]
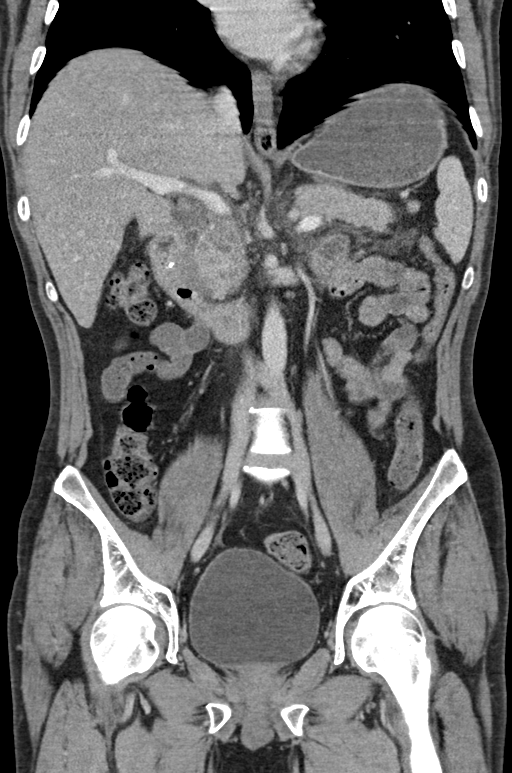

[15 of 46 positions shown; findings below may reference images not displayed]

FINDINGS: Lower chest: Lung bases demonstrate no acute consolidation or
pleural effusion. The heart size is normal.

Hepatobiliary: Contracted gallbladder. No calcified stones. No
biliary dilatation. No focal hepatic abnormality.

Pancreas: Enlarged. Moderate surrounding inflammatory changes
consistent with acute pancreatitis. Small rounded fluid collections
at the pancreatic head and uncinate process, the largest measures 15
mm in size. Scattered punctate calcifications at the pancreatic
head.

Spleen: Normal in size without focal abnormality.

Adrenals/Urinary Tract: Adrenal glands are unremarkable. Kidneys are
normal, without renal calculi, focal lesion, or hydronephrosis.
Bladder is unremarkable.

Stomach/Bowel: Moderate enlargement of the stomach. Thickened
appearance of the duodenal bulb and second and proximal third
portion of the duodenum. No dilated small bowel. No colon wall
thickening. Appendix not well seen but no right lower quadrant
inflammatory process

Vascular/Lymphatic: Nonaneurysmal aorta. Patent portal veins. Slight
narrowed appearance of the distal splenic vein but without
occlusion.

Reproductive: Prostate is unremarkable.

Other: No free air. Small fluid within the anterior pararenal
spaces.

Musculoskeletal: Mild superior endplate deformity at L1.
IMPRESSION: 1. Enlarged pancreas with moderate edema and inflammatory changes
consistent with acute pancreatitis. Several small focal fluid
collections are present at the pancreatic head and uncinate process
measuring up to 15 mm, possible small pseudocysts versus cystic
pancreatic masses.
2. Thickening of the duodenal bulb, second and proximal third
portion of the duodenum consistent with duodenitis and reactive
inflammation from pancreatitis
3. Narrowed appearance of the distal splenic vein likely extrinsic
compression from inflammation. Portal vessels are patent.
